# Patient Record
Sex: Female | Born: 1985 | Race: White | Hispanic: No | Marital: Single
Health system: Southern US, Community
[De-identification: ages and names within clinical notes are randomized; demographics above are authoritative.]

---

## 2016-10-31 DIAGNOSIS — C4491 Basal cell carcinoma of skin, unspecified: Secondary | ICD-10-CM

## 2016-10-31 HISTORY — DX: Basal cell carcinoma of skin, unspecified: C44.91

## 2020-07-25 ENCOUNTER — Other Ambulatory Visit: Payer: Self-pay

## 2020-07-25 DIAGNOSIS — Z20822 Contact with and (suspected) exposure to covid-19: Secondary | ICD-10-CM

## 2020-07-27 LAB — NOVEL CORONAVIRUS, NAA: SARS-CoV-2, NAA: NOT DETECTED

## 2020-07-27 LAB — SARS-COV-2, NAA 2 DAY TAT

## 2020-09-17 ENCOUNTER — Ambulatory Visit: Payer: BC Managed Care – PPO | Admitting: Dermatology

## 2020-09-17 ENCOUNTER — Encounter: Payer: Self-pay | Admitting: Dermatology

## 2020-09-17 ENCOUNTER — Other Ambulatory Visit: Payer: Self-pay

## 2020-09-17 DIAGNOSIS — L578 Other skin changes due to chronic exposure to nonionizing radiation: Secondary | ICD-10-CM

## 2020-09-17 DIAGNOSIS — L821 Other seborrheic keratosis: Secondary | ICD-10-CM

## 2020-09-17 DIAGNOSIS — L719 Rosacea, unspecified: Secondary | ICD-10-CM

## 2020-09-17 DIAGNOSIS — D1800 Hemangioma unspecified site: Secondary | ICD-10-CM

## 2020-09-17 DIAGNOSIS — D229 Melanocytic nevi, unspecified: Secondary | ICD-10-CM

## 2020-09-17 DIAGNOSIS — Z1283 Encounter for screening for malignant neoplasm of skin: Secondary | ICD-10-CM | POA: Diagnosis not present

## 2020-09-17 DIAGNOSIS — D2372 Other benign neoplasm of skin of left lower limb, including hip: Secondary | ICD-10-CM | POA: Diagnosis not present

## 2020-09-17 DIAGNOSIS — L858 Other specified epidermal thickening: Secondary | ICD-10-CM

## 2020-09-17 DIAGNOSIS — D239 Other benign neoplasm of skin, unspecified: Secondary | ICD-10-CM

## 2020-09-17 DIAGNOSIS — D485 Neoplasm of uncertain behavior of skin: Secondary | ICD-10-CM | POA: Diagnosis not present

## 2020-09-17 DIAGNOSIS — Z85828 Personal history of other malignant neoplasm of skin: Secondary | ICD-10-CM

## 2020-09-17 HISTORY — DX: Melanocytic nevi, unspecified: D22.9

## 2020-09-17 NOTE — Patient Instructions (Addendum)
Wound Care Instructions  1. Cleanse wound gently with soap and water once a day then pat dry with clean gauze. Apply a thing coat of Petrolatum (petroleum jelly, "Vaseline") over the wound (unless you have an allergy to this). We recommend that you use a new, sterile tube of Vaseline. Do not pick or remove scabs. Do not remove the yellow or white "healing tissue" from the base of the wound.  2. Cover the wound with fresh, clean, nonstick gauze and secure with paper tape. You may use Band-Aids in place of gauze and tape if the would is small enough, but would recommend trimming much of the tape off as there is often too much. Sometimes Band-Aids can irritate the skin.  3. You should call the office for your biopsy report after 1 week if you have not already been contacted.  4. If you experience any problems, such as abnormal amounts of bleeding, swelling, significant bruising, significant pain, or evidence of infection, please call the office immediately.  5. FOR ADULT SURGERY PATIENTS: If you need something for pain relief you may take 1 extra strength Tylenol (acetaminophen) AND 2 Ibuprofen (200mg each) together every 4 hours as needed for pain. (do not take these if you are allergic to them or if you have a reason you should not take them.) Typically, you may only need pain medication for 1 to 3 days.   Instructions for [facility] Medications  One or more of your medications was sent to the [facility] mail order compounding pharmacy. You will receive an email from them and can purchase the medicine through that link. It will then be mailed to your home at the address you confirmed. If for any reason you do not receive an email from them, please check your spam folder. If you still do not find the email, please let us know. [facility] phone number is [phone].   

## 2020-09-17 NOTE — Progress Notes (Signed)
New Patient Visit  Subjective  Hannah Hood is a 34 y.o. female who presents for the following: Annual Exam (Hx of BCC - patient has noticed a pink flaky lesion in area of previous [place]. She has also noticed a pink papule on the tip of her nose that she would like checked). The patient presents for Total-Body Skin Exam (TBSE) for skin cancer screening and mole check.  The following portions of the chart were reviewed this encounter and updated as appropriate:  Allergies  Meds  Problems  Med Hx  Surg Hx  Fam Hx     Review of Systems:  No other skin or systemic complaints except as noted in HPI or Assessment and Plan.  Objective  Well appearing patient in no apparent distress; mood and affect are within normal limits.  A full examination was performed including scalp, head, eyes, ears, nose, lips, neck, chest, axillae, abdomen, back, buttocks, bilateral upper extremities, bilateral lower extremities, hands, feet, fingers, toes, fingernails, and toenails. All findings within normal limits unless otherwise noted below.  Objective  Face: Pinkness and papules of the cheeks, chin, and nose.  Blanching pink macule at [facility] site.   Images      Objective  B/L arms, and legs: Papules with follicular plugging.  Objective  R upper back, paraspinal: Irregular brown macule 0.7 cm   Objective  R mid lat pretibial: Irregular brown macule 0.6 cm   Objective  L med pretibial: Firm pink/brown papulenodule with dimple sign.   Objective  Right Axilla: Flesh colored papule   Assessment & Plan  Rosacea Face With rosacea papule of the nose and right chin. Consider biopsy of the R lat chin and left nasal tip sites if not resolved at follow up appt.   Start [facility] mix Azelaic Acid: 15%, Ivermectin: 1%, Metronidazole: 1% QD-BID 45g 4RF.   Keratosis pilaris B/L arms, and legs Recommend Amlactin Rapid Relief cream   Neoplasm of uncertain behavior of skin (2) R upper  back, paraspinal  Epidermal / dermal shaving  Lesion diameter (cm):  0.7 Informed consent: discussed and consent obtained   Timeout: patient name, date of birth, surgical site, and procedure verified   Procedure prep:  Patient was prepped and draped in usual sterile fashion Prep type:  Isopropyl alcohol Anesthesia: the lesion was anesthetized in a standard fashion   Anesthetic:  1% lidocaine w/ epinephrine 1-100,000 buffered w/ 8.4% NaHCO3 Instrument used: flexible razor blade   Hemostasis achieved with: pressure, aluminum chloride and electrodesiccation   Outcome: patient tolerated procedure well   Post-procedure details: sterile dressing applied and wound care instructions given   Dressing type: bandage and petrolatum    Specimen 1 - Surgical pathology Differential Diagnosis: D48.5 r/o dysplastic nevus  Check Margins: No Irregular brown macule 0.7 cm  R mid lat pretibial  Epidermal / dermal shaving  Lesion diameter (cm):  0.6 Informed consent: discussed and consent obtained   Timeout: patient name, date of birth, surgical site, and procedure verified   Procedure prep:  Patient was prepped and draped in usual sterile fashion Prep type:  Isopropyl alcohol Anesthesia: the lesion was anesthetized in a standard fashion   Anesthetic:  1% lidocaine w/ epinephrine 1-100,000 buffered w/ 8.4% NaHCO3 Instrument used: flexible razor blade   Hemostasis achieved with: pressure, aluminum chloride and electrodesiccation   Outcome: patient tolerated procedure well   Post-procedure details: sterile dressing applied and wound care instructions given   Dressing type: bandage and petrolatum  Specimen 2 - Surgical pathology Differential Diagnosis: D48.5 r/o dysplastic nevus  Check Margins: No Irregular brown macule 0.6 cm  Dermatofibroma L med pretibial Benign, observe.   Irritated nevus Right Axilla Plan shave removal at follow-up appointment   Lentigines - Scattered tan  macules - Discussed due to sun exposure - Benign, observe - Call for any changes  Seborrheic Keratoses - Stuck-on, waxy, tan-brown papules and plaques  - Discussed benign etiology and prognosis. - Observe - Call for any changes  Melanocytic Nevi - Tan-brown and/or pink-flesh-colored symmetric macules and papules - Benign appearing on exam today - Observation - Call clinic for new or changing moles - Recommend daily use of broad spectrum spf 30+ sunscreen to sun-exposed areas.   Hemangiomas - Red papules - Discussed benign nature - Observe - Call for any changes  Actinic Damage - diffuse scaly erythematous macules with underlying dyspigmentation - Recommend daily broad spectrum sunscreen SPF 30+ to sun-exposed areas, reapply every 2 hours as needed.  - Call for new or changing lesions.  History of Basal Cell Carcinoma of the Skin - right chin S/P surgery - No evidence of recurrence today - but red area to be re-checked next visit - Recommend regular full body skin exams - Recommend daily broad spectrum sunscreen SPF 30+ to sun-exposed areas, reapply every 2 hours as needed.  - Call if any new or changing lesions are noted between office visits  Return for F/U appt. in 6-8 weeks. Plan shave removal of irritated nevus (R axilla) at follow up appointment   I, Rudell Cobb, CMA, am acting as scribe for [name], MD .  Documentation: I have reviewed the above documentation for accuracy and completeness, and I agree with the above.  [name], MD

## 2020-09-19 ENCOUNTER — Telehealth: Payer: Self-pay

## 2020-09-19 NOTE — Telephone Encounter (Signed)
Left message for patient to call office for results/hd 

## 2020-09-19 NOTE — Telephone Encounter (Signed)
-----   Message from Ralene Bathe, MD sent at 09/19/2020  5:28 PM EDT ----- 1. Skin , right upper back, paraspinal DYSPLASTIC COMPOUND NEVUS WITH MILD ATYPIA, LIMITED MARGINS FREE 2. Skin , right mid lat pretibial MELANOCYTIC NEVUS WITH HYPERPIGMENTATION WITH FOCAL SCAR, SEE DESCRIPTION  1- dysplastic Mild Recheck next visit 2- benign mole  Keep follow up appt

## 2020-09-20 NOTE — Telephone Encounter (Signed)
Informed pt of results. She had no concerns.  °

## 2020-11-15 ENCOUNTER — Encounter: Payer: Self-pay | Admitting: Dermatology

## 2020-11-15 ENCOUNTER — Ambulatory Visit: Payer: BC Managed Care – PPO | Admitting: Dermatology

## 2020-11-15 ENCOUNTER — Other Ambulatory Visit: Payer: Self-pay

## 2020-11-15 DIAGNOSIS — D225 Melanocytic nevi of trunk: Secondary | ICD-10-CM

## 2020-11-15 DIAGNOSIS — Z86018 Personal history of other benign neoplasm: Secondary | ICD-10-CM | POA: Diagnosis not present

## 2020-11-15 DIAGNOSIS — D229 Melanocytic nevi, unspecified: Secondary | ICD-10-CM

## 2020-11-15 DIAGNOSIS — Z85828 Personal history of other malignant neoplasm of skin: Secondary | ICD-10-CM | POA: Diagnosis not present

## 2020-11-15 DIAGNOSIS — L719 Rosacea, unspecified: Secondary | ICD-10-CM | POA: Diagnosis not present

## 2020-11-15 DIAGNOSIS — D2339 Other benign neoplasm of skin of other parts of face: Secondary | ICD-10-CM

## 2020-11-15 DIAGNOSIS — D2330 Other benign neoplasm of skin of unspecified part of face: Secondary | ICD-10-CM

## 2020-11-15 NOTE — Patient Instructions (Signed)

## 2020-11-15 NOTE — Progress Notes (Signed)
   Follow-Up Visit   Subjective  Hannah Hood is a 34 y.o. female who presents for the following: Rosacea (Face, f/u using [facility] triple cream qhs, still have a few red spots) and dysplastic nevus bx proven (R upper back paraspinal, bx 09/17/20).  The following portions of the chart were reviewed this encounter and updated as appropriate:   Allergies  Meds  Problems  Med Hx  Surg Hx  Fam Hx     Review of Systems:  No other skin or systemic complaints except as noted in HPI or Assessment and Plan.  Objective  Well appearing patient in no apparent distress; mood and affect are within normal limits.  A focused examination was performed including R axilla. Relevant physical exam findings are noted in the Assessment and Plan.  Objective  face: Erythema face  Objective  Right Upper Back paraspinal: Scar with repigmentation  Objective  Nasal tip: 2.52mm pink blanching flat pap  Images    Objective  R lat chin: Scar with telangiectasias that blanch with diascopy   Images    Objective  Right Axilla: Flesh pap   Assessment & Plan  Rosacea face Chronic Pt may d/c [facility] Triple cream since no paps just telangiectatic rosacea  History of dysplastic nevus Right Upper Back paraspinal With repigmentation Plan shave removal on f/u  Fibrous papule of face Nasal tip Benign-appearing.  Observation.  Call clinic for new or changing moles.  Recommend daily use of broad spectrum spf 30+ sunscreen to sun-exposed areas.    History of basal cell carcinoma (BCC) R lat chin Treated with [facility] 2017 in [facility] No evidence of recurrence Observe for changes, plan bx if changes  Nevus Right Axilla Irritated nevus Plan shave removal on f/u  Return in about 2 months (around 01/16/2021) for shave removal of repigmented dysplastic nevus back and Irritated nevus shave removal.  I, Hannah Hood, RMA, am acting as scribe for [name], MD  .  Documentation: I have reviewed the above documentation for accuracy and completeness, and I agree with the above.  [name], MD

## 2020-11-21 ENCOUNTER — Encounter: Payer: Self-pay | Admitting: Dermatology

## 2021-01-21 ENCOUNTER — Ambulatory Visit: Payer: BC Managed Care – PPO | Admitting: Dermatology

## 2021-05-06 ENCOUNTER — Other Ambulatory Visit: Payer: Self-pay | Admitting: Infectious Diseases

## 2021-05-06 DIAGNOSIS — Z1231 Encounter for screening mammogram for malignant neoplasm of breast: Secondary | ICD-10-CM

## 2021-09-23 ENCOUNTER — Other Ambulatory Visit: Payer: Self-pay

## 2021-09-23 ENCOUNTER — Ambulatory Visit
Admission: RE | Admit: 2021-09-23 | Discharge: 2021-09-23 | Disposition: A | Discharge status: Home / Self Care | Payer: BC Managed Care – PPO | Source: Ambulatory Visit | Attending: Infectious Diseases | Admitting: Infectious Diseases

## 2021-09-23 DIAGNOSIS — Z1231 Encounter for screening mammogram for malignant neoplasm of breast: Secondary | ICD-10-CM | POA: Insufficient documentation

## 2021-12-04 ENCOUNTER — Ambulatory Visit: Payer: BC Managed Care – PPO | Admitting: Dermatology

## 2021-12-04 ENCOUNTER — Other Ambulatory Visit: Payer: Self-pay

## 2021-12-04 DIAGNOSIS — L0100 Impetigo, unspecified: Secondary | ICD-10-CM | POA: Diagnosis not present

## 2021-12-04 DIAGNOSIS — L089 Local infection of the skin and subcutaneous tissue, unspecified: Secondary | ICD-10-CM

## 2021-12-04 DIAGNOSIS — D485 Neoplasm of uncertain behavior of skin: Secondary | ICD-10-CM | POA: Diagnosis not present

## 2021-12-04 DIAGNOSIS — Z86018 Personal history of other benign neoplasm: Secondary | ICD-10-CM | POA: Diagnosis not present

## 2021-12-04 MED ORDER — XEPI 1 % EX CREA: TOPICAL_CREAM | CUTANEOUS | 0 refills | Status: AC

## 2021-12-04 MED ORDER — MUPIROCIN 2 % EX OINT: TOPICAL_OINTMENT | CUTANEOUS | 0 refills | Status: DC

## 2021-12-04 MED ORDER — DOXYCYCLINE HYCLATE 100 MG PO TABS: ORAL_TABLET | ORAL | 0 refills | Status: AC

## 2021-12-04 NOTE — Progress Notes (Signed)
° °  Follow-Up Visit   Subjective  Hannah Hood is a 36 y.o. female who presents for the following: Irregular skin lesion (Of the L temple/hairline - new, has been growing in size, tender, patient is concerned and would like it removed today.), Re-pigmented dysplastic nevus (Of the R upper back paraspinal - bx proven 09/17/2020, patient is here today for shave removal), and Irritated skin lesion (R axilla - patient would like it removed today).  The following portions of the chart were reviewed this encounter and updated as appropriate:   Allergies   Meds   Problems   Med Hx   Surg Hx   Fam Hx      Review of Systems:  No other skin or systemic complaints except as noted in HPI or Assessment and Plan.  Objective  Well appearing patient in no apparent distress; mood and affect are within normal limits.  A focused examination was performed including the face, axilla, and back. Relevant physical exam findings are noted in the Assessment and Plan.  L temple Crusted plaque.     R upper back paraspinal Re-pigmented brown macule.   Right Axilla Papule.   Assessment & Plan  Local infection of skin and subcutaneous tissue Crusted plaque with impetigo L temple Start Doxycycline 100mg  po BID x 10 days.  Doxycycline should be taken with food to prevent nausea. Do not lay down for 30 minutes after taking. Be cautious with sun exposure and use good sun protection while on this medication. Pregnant women should not take this medication.   Start Xepi or Mupirocin 2% ointment QD.   Plan shave removal or debridement if crusted plaque persists at follow up once no longer infected.  Ozenoxacin (XEPI) 1 % CREA - L temple Apply to aa's QD until healed. mupirocin ointment (BACTROBAN) 2 % - L temple Apply to wound QD until healed. doxycycline (VIBRA-TABS) 100 MG tablet - L temple Take one tab po BID x 10 days. Take with food.  History of dysplastic nevus R upper back paraspinal Mild, bx proven  - Plan shave removal at follow up appointment.   Neoplasm of uncertain behavior of skin Right Axilla Irritated skin lesion - plan shave removal at follow up appointment.  Return for shave removal x 3 in 4-6 weeks.  Luther Redo, CMA, am acting as scribe for [name], MD . Documentation: I have reviewed the above documentation for accuracy and completeness, and I agree with the above.  [name], MD

## 2021-12-04 NOTE — Patient Instructions (Addendum)

## 2021-12-05 ENCOUNTER — Encounter: Payer: Self-pay | Admitting: Dermatology

## 2022-01-06 ENCOUNTER — Ambulatory Visit: Payer: BC Managed Care – PPO | Admitting: Dermatology

## 2022-01-06 ENCOUNTER — Encounter: Payer: Self-pay | Admitting: Dermatology

## 2022-01-06 ENCOUNTER — Other Ambulatory Visit: Payer: Self-pay

## 2022-01-06 DIAGNOSIS — D485 Neoplasm of uncertain behavior of skin: Secondary | ICD-10-CM

## 2022-01-06 DIAGNOSIS — D235 Other benign neoplasm of skin of trunk: Secondary | ICD-10-CM

## 2022-01-06 DIAGNOSIS — C44319 Basal cell carcinoma of skin of other parts of face: Secondary | ICD-10-CM

## 2022-01-06 DIAGNOSIS — Z85828 Personal history of other malignant neoplasm of skin: Secondary | ICD-10-CM | POA: Diagnosis not present

## 2022-01-06 DIAGNOSIS — D239 Other benign neoplasm of skin, unspecified: Secondary | ICD-10-CM

## 2022-01-06 DIAGNOSIS — L089 Local infection of the skin and subcutaneous tissue, unspecified: Secondary | ICD-10-CM

## 2022-01-06 DIAGNOSIS — Z86018 Personal history of other benign neoplasm: Secondary | ICD-10-CM | POA: Diagnosis not present

## 2022-01-06 MED ORDER — MUPIROCIN 2 % EX OINT: TOPICAL_OINTMENT | CUTANEOUS | 0 refills | Status: DC

## 2022-01-06 NOTE — Progress Notes (Signed)
Follow-Up Visit   Subjective  Hannah Hood is a 36 y.o. female who presents for the following: Irregular skin lesion (On the L scalp - patient finished Doxycycline and used the Mupirocin 2% ointment TID. She thinks lesion is no longer infected but is still crusted and irritated.). Patient was also scheduled to have an irritated skin lesion on her R axilla and a recurrent dysplastic nevus that needs to be to be removed. She is leaving for a cruise in 5 days and would like to defer removing those at this time.  She has hx of MOHS for BCC chin in past.  The following portions of the chart were reviewed this encounter and updated as appropriate:   Allergies   Meds   Problems   Med Hx   Surg Hx   Fam Hx      Review of Systems:  No other skin or systemic complaints except as noted in HPI or Assessment and Plan.  Objective  Well appearing patient in no apparent distress; mood and affect are within normal limits.  A focused examination was performed including the scalp. Relevant physical exam findings are noted in the Assessment and Plan.  L temple/scalp 3.1 cm pink crusted plaque.  Right Axilla Papule.  Assessment & Plan  Neoplasm of uncertain behavior of skin L temple/scalp Epidermal / dermal shaving  Lesion diameter (cm):  3.1 Informed consent: discussed and consent obtained   Timeout: patient name, date of birth, surgical site, and procedure verified   Procedure prep:  Patient was prepped and draped in usual sterile fashion Prep type:  Isopropyl alcohol Anesthesia: the lesion was anesthetized in a standard fashion   Anesthetic:  1% lidocaine w/ epinephrine 1-100,000 buffered w/ 8.4% NaHCO3 Instrument used: flexible razor blade   Hemostasis achieved with: pressure, aluminum chloride and electrodesiccation   Outcome: patient tolerated procedure well   Post-procedure details: sterile dressing applied and wound care instructions given   Dressing type: bandage and petrolatum     Destruction of lesion Complexity: extensive   Destruction method: electrodesiccation and curettage   Informed consent: discussed and consent obtained   Timeout:  patient name, date of birth, surgical site, and procedure verified Procedure prep:  Patient was prepped and draped in usual sterile fashion Prep type:  Isopropyl alcohol Anesthesia: the lesion was anesthetized in a standard fashion   Anesthetic:  1% lidocaine w/ epinephrine 1-100,000 buffered w/ 8.4% NaHCO3 Curettage performed in three different directions: Yes   Electrodesiccation performed over the curetted area: Yes   Lesion length (cm):  3.1 Lesion width (cm):  3.1 Margin per side (cm):  0.3 Final wound size (cm):  3.7 Hemostasis achieved with:  pressure, aluminum chloride and electrodesiccation Outcome: patient tolerated procedure well with no complications   Post-procedure details: sterile dressing applied and wound care instructions given   Dressing type: bandage and petrolatum    Specimen 1 - Surgical pathology Differential Diagnosis: D48.5 r/o BCC vs other  ED&C today  Check Margins: No  No longer infected, but persistent, tender, and bleeds.  Continue Mupirocin 2% ointment QD to aa until healed. Once healed recommend Serica moisturizing scar formula cream every night or [facility] brand or Mederma silicone scar sheet every night for the first year after a scar appears to help with scar remodeling if desired. Scars remodel on their own for a full year.  History of Basal Cell Carcinoma of the Skin - chin s/p Mohs - No evidence of recurrence today - Recommend regular full  body skin exams - Recommend daily broad spectrum sunscreen SPF 30+ to sun-exposed areas, reapply every 2 hours as needed.  - Call if any new or changing lesions are noted between office visits  History of dysplastic nevus R upper back paraspinal Bx proven - mild, recurrent  Plan shave removal at follow up appointment.  Other benign neoplasm  of skin, unspecified Right Axilla Irritated nevus vs other -  Plan shave removal at follow up appointment.  doxycycline (VIBRA-TABS) 100 MG tablet Take one tab po BID x 10 days. Take with food. mupirocin ointment (BACTROBAN) 2 % Apply to wound QD until healed.  Return for TBSE in 4-6 weeks, bx x 2.  I, Rudell Cobb, CMA, am acting as scribe for [name], MD . Documentation: I have reviewed the above documentation for accuracy and completeness, and I agree with the above.  [name], MD

## 2022-01-06 NOTE — Patient Instructions (Addendum)
Electrodesiccation and Curettage (Scrape and Burn) Wound Care Instructions  Leave the original bandage on for 24 hours if possible.  If the bandage becomes soaked or soiled before that time, it is OK to remove it and examine the wound.  A small amount of post-operative bleeding is normal.  If excessive bleeding occurs, remove the bandage, place gauze over the site and apply continuous pressure (no peeking) over the area for 30 minutes. If this does not work, please call our clinic as soon as possible or page your doctor if it is after hours.   Once a day, cleanse the wound with soap and water. It is fine to shower. If a thick crust develops you may use a Q-tip dipped into dilute hydrogen peroxide (mix 1:1 with water) to dissolve it.  Hydrogen peroxide can slow the healing process, so use it only as needed.    After washing, apply petroleum jelly (Vaseline) or an antibiotic ointment if your doctor prescribed one for you, followed by a bandage.    For best healing, the wound should be covered with a layer of ointment at all times. If you are not able to keep the area covered with a bandage to hold the ointment in place, this may mean re-applying the ointment several times a day.  Continue this wound care until the wound has healed and is no longer open. It may take several weeks for the wound to heal and close.  Itching and mild discomfort is normal during the healing process.  If you have any discomfort, you can take Tylenol (acetaminophen) or ibuprofen as directed on the bottle. (Please do not take these if you have an allergy to them or cannot take them for another reason).  Some redness, tenderness and white or yellow material in the wound is normal healing.  If the area becomes very sore and red, or develops a thick yellow-green material (pus), it may be infected; please notify us.    Wound healing continues for up to one year following surgery. It is not unusual to experience pain in the scar  from time to time during the interval.  If the pain becomes severe or the scar thickens, you should notify the office.    A slight amount of redness in a scar is expected for the first six months.  After six months, the redness will fade and the scar will soften and fade.  The color difference becomes less noticeable with time.  If there are any problems, return for a post-op surgery check at your earliest convenience.  To improve the appearance of the scar, you can use silicone scar gel, cream, or sheets (such as Mederma or Serica) every night for up to one year. These are available over the counter (without a prescription).  Please call our office at [phone] for any questions or concerns.  If You Need Anything After Your Visit  If you have any questions or concerns for your doctor, please call our main line at [phone] and press option 4 to reach your doctor's medical assistant. If no one answers, please leave a voicemail as directed and we will return your call as soon as possible. Messages left after 4 pm will be answered the following business day.   You may also send [place] a message via [place]. We typically respond to MyChart messages within 1-2 business days.  For prescription refills, please ask your pharmacy to contact our office. Our fax number is [phone].  If you have an  urgent issue when the clinic is closed that cannot wait until the next business day, you can page your doctor at the number below.    Please note that while we do our best to be available for urgent issues outside of office hours, we are not available 24/7.   If you have an urgent issue and are unable to reach [place], you may choose to seek medical care at your doctor's office, retail clinic, urgent care center, or emergency room.  If you have a medical emergency, please immediately call 911 or go to the emergency department.  Pager Numbers  - Dr. Nehemiah Massed: [phone]  - Dr. Laurence Ferrari: [phone]  -  Dr. Nicole Kindred: [phone]  In the event of inclement weather, please call our main line at [phone] for an update on the status of any delays or closures.  Dermatology Medication Tips: Please keep the boxes that topical medications come in in order to help keep track of the instructions about where and how to use these. Pharmacies typically print the medication instructions only on the boxes and not directly on the medication tubes.   If your medication is too expensive, please contact our office at [phone] option 4 or send [place] a message through [place].   We are unable to tell what your co-pay for medications will be in advance as this is different depending on your insurance coverage. However, we may be able to find a substitute medication at lower cost or fill out paperwork to get insurance to cover a needed medication.   If a prior authorization is required to get your medication covered by your insurance company, please allow [place] 1-2 business days to complete this process.  Drug prices often vary depending on where the prescription is filled and some pharmacies may offer cheaper prices.  The website www.goodrx.com contains coupons for medications through different pharmacies. The prices here do not account for what the cost may be with help from insurance (it may be cheaper with your insurance), but the website can give you the price if you did not use any insurance.  - You can print the associated coupon and take it with your prescription to the pharmacy.  - You may also stop by our office during regular business hours and pick up a GoodRx coupon card.  - If you need your prescription sent electronically to a different pharmacy, notify our office through [facility] or by phone at [phone] option 4.     Si Usted Necesita Algo Despus de Su Visita  Tambin puede enviarnos un mensaje a travs de Pharmacist, community. Por lo general respondemos a los mensajes de MyChart en el  transcurso de 1 a 2 das hbiles.  Para renovar recetas, por favor pida a su farmacia que se ponga en contacto con nuestra oficina. Harland Dingwall de fax es [place] [phone].  Si tiene un asunto urgente cuando la clnica est cerrada y que no puede esperar hasta el siguiente da hbil, puede llamar/localizar a su doctor(a) al nmero que aparece a continuacin.   Por favor, tenga en cuenta que aunque hacemos todo lo posible para estar disponibles para asuntos urgentes fuera del horario de [place], no estamos disponibles las 24 horas del da, los 7 das de la [place].   Si tiene un problema urgente y no puede comunicarse con nosotros, puede optar por buscar atencin mdica  en el consultorio de su doctor(a), en una clnica privada, en un centro de atencin urgente o en una sala de emergencias.  Si tiene Engineering geologist, por favor llame inmediatamente al 911 o vaya a la sala de emergencias.  Nmeros de bper  - Dr. Nehemiah Massed: [phone]  - Dra. Moye: [phone]  - Dra. Nicole Kindred: [phone]  En caso de inclemencias del [place], por favor llame a Johnsie Kindred principal al [phone] para una actualizacin sobre el [place] de cualquier retraso o cierre.  Consejos para la medicacin en dermatologa: Por favor, guarde las cajas en las que vienen los medicamentos de uso tpico para ayudarle a seguir las instrucciones sobre dnde y cmo usarlos. Las farmacias generalmente imprimen las instrucciones del medicamento slo en las cajas y no directamente en los tubos del [place].   Si su medicamento es muy caro, por favor, pngase en contacto con Zigmund Daniel llamando al [phone] y presione la opcin 4 o envenos un mensaje a travs de Pharmacist, community.   No podemos decirle cul ser su copago por los medicamentos por adelantado ya que esto es diferente dependiendo de la cobertura de su seguro. Sin embargo, es posible que podamos encontrar un medicamento sustituto a Electrical engineer un  formulario para que el seguro cubra el medicamento que se considera necesario.   Si se requiere una autorizacin previa para que su compaa de seguros [place] su medicamento, por favor permtanos de 1 a 2 das hbiles para completar este proceso.  Los precios de los medicamentos varan con frecuencia dependiendo del Environmental consultant de dnde se surte la receta y alguna farmacias pueden ofrecer precios ms baratos.  El sitio web www.goodrx.com tiene cupones para medicamentos de Airline pilot. Los precios aqu no tienen en cuenta lo que podra costar con la ayuda del seguro (puede ser ms barato con su seguro), pero el sitio web puede darle el precio si no utiliz Research scientist (physical sciences).  - Puede imprimir el cupn correspondiente y llevarlo con su receta a la farmacia.  - Tambin puede pasar por nuestra oficina durante el horario de atencin regular y Charity fundraiser una tarjeta de cupones de GoodRx.  - Si necesita que su receta se enve electrnicamente a una farmacia diferente, informe a nuestra oficina a travs de MyChart de [place] o por telfono llamando al [phone] y presione la opcin 4.

## 2022-01-07 ENCOUNTER — Telehealth: Payer: Self-pay

## 2022-01-07 ENCOUNTER — Encounter: Payer: Self-pay | Admitting: Dermatology

## 2022-01-07 NOTE — Telephone Encounter (Signed)
-----   Message from Ralene Bathe, MD sent at 01/07/2022  4:29 PM EST ----- Diagnosis Skin (M), left temple/scalp BASAL CELL CARCINOMA WITH FOCAL SCLEROSIS, ULCERATED, SEE DESCRIPTION  Cancer - BCC Already treated Recheck next visit - keep appt

## 2022-01-07 NOTE — Telephone Encounter (Signed)
Discussed biopsy results with pt  °

## 2022-01-08 ENCOUNTER — Ambulatory Visit: Payer: BC Managed Care – PPO | Admitting: Dermatology

## 2022-01-21 ENCOUNTER — Other Ambulatory Visit: Payer: Self-pay

## 2022-01-21 DIAGNOSIS — L089 Local infection of the skin and subcutaneous tissue, unspecified: Secondary | ICD-10-CM

## 2022-01-21 MED ORDER — MUPIROCIN 2 % EX OINT: TOPICAL_OINTMENT | CUTANEOUS | 0 refills | Status: AC

## 2022-02-03 ENCOUNTER — Encounter: Payer: Self-pay | Admitting: Dermatology

## 2022-02-03 ENCOUNTER — Ambulatory Visit: Payer: BC Managed Care – PPO | Admitting: Dermatology

## 2022-02-03 ENCOUNTER — Other Ambulatory Visit: Payer: Self-pay

## 2022-02-03 DIAGNOSIS — L814 Other melanin hyperpigmentation: Secondary | ICD-10-CM

## 2022-02-03 DIAGNOSIS — Z85828 Personal history of other malignant neoplasm of skin: Secondary | ICD-10-CM | POA: Diagnosis not present

## 2022-02-03 DIAGNOSIS — Z1283 Encounter for screening for malignant neoplasm of skin: Secondary | ICD-10-CM

## 2022-02-03 DIAGNOSIS — D225 Melanocytic nevi of trunk: Secondary | ICD-10-CM

## 2022-02-03 DIAGNOSIS — L578 Other skin changes due to chronic exposure to nonionizing radiation: Secondary | ICD-10-CM

## 2022-02-03 DIAGNOSIS — Z86018 Personal history of other benign neoplasm: Secondary | ICD-10-CM

## 2022-02-03 DIAGNOSIS — D229 Melanocytic nevi, unspecified: Secondary | ICD-10-CM

## 2022-02-03 DIAGNOSIS — D485 Neoplasm of uncertain behavior of skin: Secondary | ICD-10-CM

## 2022-02-03 DIAGNOSIS — L821 Other seborrheic keratosis: Secondary | ICD-10-CM

## 2022-02-03 DIAGNOSIS — D1800 Hemangioma unspecified site: Secondary | ICD-10-CM

## 2022-02-03 DIAGNOSIS — L719 Rosacea, unspecified: Secondary | ICD-10-CM | POA: Diagnosis not present

## 2022-02-03 DIAGNOSIS — R21 Rash and other nonspecific skin eruption: Secondary | ICD-10-CM | POA: Diagnosis not present

## 2022-02-03 NOTE — Progress Notes (Signed)
? ?Follow-Up Visit ?  ?Subjective  ?Hannah Hood is a 36 y.o. female who presents for the following: Annual Exam (History of BCC and Dysplastic nevus - TBSE today). ?The patient presents for Total-Body Skin Exam (TBSE) for skin cancer screening and mole check.  The patient has spots, moles and lesions to be evaluated, some may be new or changing and the patient has concerns that these could be cancer. ? ?The following portions of the chart were reviewed this encounter and updated as appropriate:  ? Allergies  Meds  Problems  Med Hx  Surg Hx  Fam Hx   ?  ?Review of Systems:  No other skin or systemic complaints except as noted in HPI or Assessment and Plan. ? ?Objective  ?Well appearing patient in no apparent distress; mood and affect are within normal limits. ? ?A full examination was performed including scalp, head, eyes, ears, nose, lips, neck, chest, axillae, abdomen, back, buttocks, bilateral upper extremities, bilateral lower extremities, hands, feet, fingers, toes, fingernails, and toenails. All findings within normal limits unless otherwise noted below. ? ?Left Temple/scalp ?Healing EDC site ? ?Rigth chin ?0.9 x 0.7 cm pink patch medial to BCC scar ? ? ? ? ?Face ?Pinkness ? ?Right Axilla ?0.7 cm flesh colored papule ? ? ? ? ?Right Upper Back paraspinal ?Well healed biopsy site with 0.4 cm area of repigmentation ? ? ? ? ? ?Assessment & Plan  ? ?History of Basal Cell Carcinoma of the Skin-left temple site still healing and ulcerated.  Recheck next visit ?- No evidence of recurrence today ?- Recommend regular full body skin exams ?- Recommend daily broad spectrum sunscreen SPF 30+ to sun-exposed areas, reapply every 2 hours as needed.  ?- Call if any new or changing lesions are noted between office visits ? ?History of Dysplastic Nevi ?- No evidence of recurrence today ?- Recommend regular full body skin exams ?- Recommend daily broad spectrum sunscreen SPF 30+ to sun-exposed areas, reapply every 2 hours  as needed.  ?- Call if any new or changing lesions are noted between office visits ? ?Lentigines ?- Scattered tan macules ?- Due to sun exposure ?- Benign-appearing, observe ?- Recommend daily broad spectrum sunscreen SPF 30+ to sun-exposed areas, reapply every 2 hours as needed. ?- Call for any changes ? ?Seborrheic Keratoses ?- Stuck-on, waxy, tan-brown papules and/or plaques  ?- Benign-appearing ?- Discussed benign etiology and prognosis. ?- Observe ?- Call for any changes ? ?Melanocytic Nevi ?- Tan-brown and/or pink-flesh-colored symmetric macules and papules ?- Benign appearing on exam today ?- Observation ?- Call clinic for new or changing moles ?- Recommend daily use of broad spectrum spf 30+ sunscreen to sun-exposed areas.  ? ?Hemangiomas ?- Red papules ?- Discussed benign nature ?- Observe ?- Call for any changes ? ?Actinic Damage ?- Chronic condition, secondary to cumulative UV/sun exposure ?- diffuse scaly erythematous macules with underlying dyspigmentation ?- Recommend daily broad spectrum sunscreen SPF 30+ to sun-exposed areas, reapply every 2 hours as needed.  ?- Staying in the shade or wearing long sleeves, sun glasses (UVA+UVB protection) and wide brim hats (4-inch brim around the entire circumference of the hat) are also recommended for sun protection.  ?- Call for new or changing lesions. ? ?Skin cancer screening performed today. ? ?History of basal cell carcinoma (BCC) ?Left Temple/scalp ? ?Healing well. Discussed risk of recurrence. Recheck on follow up ? ?Rash ?Right chin medial edge of BCC excision scar 0.9 x 0.7 cm pink patch ?Avar LS qd - samples  given ?Recheck on follow up. Consider biopsy vs topical chemotherapy ? ?Rosacea ?Face ?Rosacea is a chronic progressive skin condition usually affecting the face of adults, causing redness and/or acne bumps. It is treatable but not curable. It sometimes affects the eyes (ocular rosacea) as well. It may respond to topical and/or systemic medication  and can flare with stress, sun exposure, alcohol, exercise and some foods.  Daily application of broad spectrum spf 30+ sunscreen to face is recommended to reduce flares. ?Discussed the treatment option of BBL/laser.  Typically we recommend 1-3 treatment sessions about 5-8 weeks apart for best results.  The patient's condition may require "maintenance treatments" in the future.  The fee for BBL / laser treatments is $350 per treatment session for the whole face.  A fee can be quoted for other parts of the body. ?Insurance typically does not pay for BBL/laser treatments and therefore the fee is an out-of-pocket cost. ? ?Mild - no treatment today. ? ?Neoplasm of uncertain behavior of skin (2) ?Right Axilla ?Epidermal / dermal shaving ?Lesion diameter (cm):  0.7 ?Informed consent: discussed and consent obtained   ?Timeout: patient name, date of birth, surgical site, and procedure verified   ?Procedure prep:  Patient was prepped and draped in usual sterile fashion ?Prep type:  Isopropyl alcohol ?Anesthesia: the lesion was anesthetized in a standard fashion   ?Anesthetic:  1% lidocaine w/ epinephrine 1-100,000 buffered w/ 8.4% NaHCO3 ?Instrument used: flexible razor blade   ?Hemostasis achieved with: pressure, aluminum chloride and electrodesiccation   ?Outcome: patient tolerated procedure well   ?Post-procedure details: sterile dressing applied and wound care instructions given   ?Dressing type: bandage and petrolatum   ? ?Specimen 1 - Surgical pathology ?Differential Diagnosis: Irritated nevus vs other ?Check Margins: No ? ?Right Upper Back paraspinal ?Epidermal / dermal shaving ? ?Lesion diameter (cm):  0.4 ?Informed consent: discussed and consent obtained   ?Timeout: patient name, date of birth, surgical site, and procedure verified   ?Procedure prep:  Patient was prepped and draped in usual sterile fashion ?Prep type:  Isopropyl alcohol ?Anesthesia: the lesion was anesthetized in a standard fashion   ?Anesthetic:   1% lidocaine w/ epinephrine 1-100,000 buffered w/ 8.4% NaHCO3 ?Instrument used: flexible razor blade   ?Hemostasis achieved with: pressure, aluminum chloride and electrodesiccation   ?Outcome: patient tolerated procedure well   ?Post-procedure details: sterile dressing applied and wound care instructions given   ?Dressing type: bandage and petrolatum   ? ?Specimen 2 - Surgical pathology ?Differential Diagnosis: Recurrent dysplastic nevus vs other  ?Check Margins: No ?[id number] ? ?Skin cancer screening ? ?Return for 2-3 months Recheck BCC site of left temple and recheck right chin. ?Documentation: I have reviewed the above documentation for accuracy and completeness, and I agree with the above. ? ?[name], MD ? ? ?

## 2022-02-03 NOTE — Patient Instructions (Signed)

## 2022-02-05 ENCOUNTER — Telehealth: Payer: Self-pay

## 2022-02-05 NOTE — Telephone Encounter (Signed)
-----   Message from Ralene Bathe, MD sent at 02/04/2022  6:28 PM EST ----- ?Diagnosis ?1. Skin , right axilla ?MELANOCYTIC NEVUS, COMPOUND TYPE ?2. Skin , right upper back paraspinal ?RECURRENT MELANOCYTIC NEVUS, CLOSE TO MARGIN ? ?1- benign mole ?No further treatment needed ?2- Recurrent mole ?At site of mild dysplastic ?Recheck next visit ?

## 2022-02-05 NOTE — Telephone Encounter (Signed)
Left message on voicemail to return my call.  

## 2022-02-05 NOTE — Telephone Encounter (Signed)
Patient advised of BX results .aw 

## 2022-02-10 ENCOUNTER — Ambulatory Visit: Payer: BC Managed Care – PPO | Admitting: Dermatology

## 2022-02-13 ENCOUNTER — Ambulatory Visit: Payer: BC Managed Care – PPO | Admitting: Dermatology

## 2022-03-26 ENCOUNTER — Ambulatory Visit: Payer: BC Managed Care – PPO | Admitting: Dermatology

## 2022-03-26 DIAGNOSIS — Z85828 Personal history of other malignant neoplasm of skin: Secondary | ICD-10-CM

## 2022-03-26 DIAGNOSIS — D492 Neoplasm of unspecified behavior of bone, soft tissue, and skin: Secondary | ICD-10-CM

## 2022-03-26 DIAGNOSIS — C44319 Basal cell carcinoma of skin of other parts of face: Secondary | ICD-10-CM | POA: Diagnosis not present

## 2022-03-26 MED ORDER — FLUOROURACIL 5 % EX CREA: TOPICAL_CREAM | CUTANEOUS | 1 refills | Status: AC

## 2022-03-26 NOTE — Patient Instructions (Addendum)

## 2022-03-26 NOTE — Progress Notes (Signed)
? ?  Follow-Up Visit ?  ?Subjective  ?Hannah Hood is a 36 y.o. female who presents for the following: Follow-up (1 month f/u on the right chin ulcerated area - Self-treating with Avar cream with a poor response, hx of BCC in this area ). ? ?The following portions of the chart were reviewed this encounter and updated as appropriate:  ? Allergies  Meds  Problems  Med Hx  Surg Hx  Fam Hx   ?  ?Review of Systems:  No other skin or systemic complaints except as noted in HPI or Assessment and Plan. ? ?Objective  ?Well appearing patient in no apparent distress; mood and affect are within normal limits. ? ?A focused examination was performed including face. Relevant physical exam findings are noted in the Assessment and Plan. ? ?left temple ?Pink scar ? ?right chin ?1.0 cm Pink  patch  ? ? ? ? ? ? ? ?Assessment & Plan  ?Basal cell carcinoma (BCC) of skin of other part of face ?left temple ? ?Biopsy proven BCC treated with [facility] 01/06/2022 ? ?Start Adjunct topical Chemotherapy to Left temple Basal Cell Carcinoma site with : ?5FU/Calcipotriene cream apply to left temple twice a day x 7 days  ? ?Related Medications ?fluorouracil (EFUDEX) 5 % cream ?Apply twice a day for 7 days ? ?Neoplasm of skin ?right chin ? ?Skin / nail biopsy ?Type of biopsy: tangential   ?Informed consent: discussed and consent obtained   ?Patient was prepped and draped in usual sterile fashion: area prepped with alochol. ?Anesthesia: the lesion was anesthetized in a standard fashion   ?Anesthetic:  1% lidocaine w/ epinephrine 1-100,000 buffered w/ 8.4% NaHCO3 ?Instrument used: flexible razor blade   ?Hemostasis achieved with: pressure, aluminum chloride and electrodesiccation   ?Outcome: patient tolerated procedure well   ?Post-procedure details: wound care instructions given   ?Post-procedure details comment:  Ointment and small bandage ? ?Specimen 1 - Surgical pathology ?Differential Diagnosis: R/O recurrent BCC ? ?Check Margins: No ?Small specimen   ? ? ?Return for check as scheduled May 01, 2022. ? ?I, Marye Round, CMA, am acting as scribe for [name], MD .  ?Documentation: I have reviewed the above documentation for accuracy and completeness, and I agree with the above. ? ?[name], MD ? ?

## 2022-04-03 ENCOUNTER — Other Ambulatory Visit: Payer: Self-pay

## 2022-04-03 ENCOUNTER — Telehealth: Payer: Self-pay

## 2022-04-03 DIAGNOSIS — C44319 Basal cell carcinoma of skin of other parts of face: Secondary | ICD-10-CM

## 2022-04-03 NOTE — Telephone Encounter (Signed)
-----   Message from Ralene Bathe, MD sent at 04/01/2022  9:30 PM EDT ----- ?Diagnosis ?Skin , right chin ?BASAL CELL CARCINOMA, NODULAR PATTERN, BASE INVOLVED ? ?Cancer - BCC ?Treatment options include: EDC; Radiation or MOHS ?Pt may make an appt to discuss if she would like - please schedule ?If she knows what she wants to do, please schedule appropriately ?

## 2022-04-03 NOTE — Telephone Encounter (Signed)
Discussed biopsy results with pt, pt would like to come into the office and discuss treatment options with Dr Raliegh Ip, appt scheduled ?

## 2022-04-03 NOTE — Telephone Encounter (Signed)
Pt called requesting referral to Dr Lacinda Axon for Mohs surgery of recurrent BCC  ? ?Ok we will send referral to Dr Lacinda Axon today  ?

## 2022-04-07 ENCOUNTER — Ambulatory Visit: Payer: BC Managed Care – PPO | Admitting: Dermatology

## 2022-04-07 ENCOUNTER — Encounter: Payer: Self-pay | Admitting: Dermatology

## 2022-05-01 ENCOUNTER — Ambulatory Visit: Payer: BC Managed Care – PPO | Admitting: Dermatology

## 2022-05-01 DIAGNOSIS — C44319 Basal cell carcinoma of skin of other parts of face: Secondary | ICD-10-CM | POA: Diagnosis not present

## 2022-05-01 NOTE — Patient Instructions (Signed)
Start 5-fluorouracil/calcipotriene cream twice a day for 14 days to affected areas including left temple. Prescription sent to [facility]. Patient provided with contact information for pharmacy and advised the pharmacy will mail the prescription to their home. Patient provided with handout reviewing treatment course and side effects and advised to call or message [place] on MyChart with any concerns.  Recommend daily broad spectrum sunscreen SPF 30+ to sun-exposed areas, reapply every 2 hours as needed. Call for new or changing lesions.  Staying in the shade or wearing long sleeves, sun glasses (UVA+UVB protection) and wide brim hats (4-inch brim around the entire circumference of the hat) are also recommended for sun protection.   If You Need Anything After Your Visit  If you have any questions or concerns for your doctor, please call our main line at [phone] and press option 4 to reach your doctor's medical assistant. If no one answers, please leave a voicemail as directed and we will return your call as soon as possible. Messages left after 4 pm will be answered the following business day.   You may also send [place] a message via [place]. We typically respond to MyChart messages within 1-2 business days.  For prescription refills, please ask your pharmacy to contact our office. Our fax number is [phone].  If you have an urgent issue when the clinic is closed that cannot wait until the next business day, you can page your doctor at the number below.    Please note that while we do our best to be available for urgent issues outside of office hours, we are not available 24/7.   If you have an urgent issue and are unable to reach [place], you may choose to seek medical care at your doctor's office, retail clinic, urgent care center, or emergency room.  If you have a medical emergency, please immediately call 911 or go to the emergency department.  Pager Numbers  - Dr. Nehemiah Massed:  [phone]  - Dr. Laurence Ferrari: [phone]  - Dr. Nicole Kindred: [phone]  In the event of inclement weather, please call our main line at [phone] for an update on the status of any delays or closures.  Dermatology Medication Tips: Please keep the boxes that topical medications come in in order to help keep track of the instructions about where and how to use these. Pharmacies typically print the medication instructions only on the boxes and not directly on the medication tubes.   If your medication is too expensive, please contact our office at [phone] option 4 or send [place] a message through [place].   We are unable to tell what your co-pay for medications will be in advance as this is different depending on your insurance coverage. However, we may be able to find a substitute medication at lower cost or fill out paperwork to get insurance to cover a needed medication.   If a prior authorization is required to get your medication covered by your insurance company, please allow [place] 1-2 business days to complete this process.  Drug prices often vary depending on where the prescription is filled and some pharmacies may offer cheaper prices.  The website www.goodrx.com contains coupons for medications through different pharmacies. The prices here do not account for what the cost may be with help from insurance (it may be cheaper with your insurance), but the website can give you the price if you did not use any insurance.  - You can print the associated coupon and take it with your prescription to  the pharmacy.  - You may also stop by our office during regular business hours and pick up a GoodRx coupon card.  - If you need your prescription sent electronically to a different pharmacy, notify our office through [facility] or by phone at [phone] option 4.     Si Usted Necesita Algo Despus de Su Visita  Tambin puede enviarnos un mensaje a travs de Pharmacist, community. Por lo general  respondemos a los mensajes de MyChart en el transcurso de 1 a 2 das hbiles.  Para renovar recetas, por favor pida a su farmacia que se ponga en contacto con nuestra oficina. Harland Dingwall de fax es [place] [phone].  Si tiene un asunto urgente cuando la clnica est cerrada y que no puede esperar hasta el siguiente da hbil, puede llamar/localizar a su doctor(a) al nmero que aparece a continuacin.   Por favor, tenga en cuenta que aunque hacemos todo lo posible para estar disponibles para asuntos urgentes fuera del horario de [place], no estamos disponibles las 24 horas del da, los 7 das de la [place].   Si tiene un problema urgente y no puede comunicarse con nosotros, puede optar por buscar atencin mdica  en el consultorio de su doctor(a), en una clnica privada, en un centro de atencin urgente o en una sala de emergencias.  Si tiene Engineering geologist, por favor llame inmediatamente al 911 o vaya a la sala de emergencias.  Nmeros de bper  - Dr. Nehemiah Massed: [phone]  - Dra. Moye: [phone]  - Dra. Nicole Kindred: [phone]  En caso de inclemencias del [place], por favor llame a Johnsie Kindred principal al [phone] para una actualizacin sobre el [place] de cualquier retraso o cierre.  Consejos para la medicacin en dermatologa: Por favor, guarde las cajas en las que vienen los medicamentos de uso tpico para ayudarle a seguir las instrucciones sobre dnde y cmo usarlos. Las farmacias generalmente imprimen las instrucciones del medicamento slo en las cajas y no directamente en los tubos del [place].   Si su medicamento es muy caro, por favor, pngase en contacto con Zigmund Daniel llamando al [phone] y presione la opcin 4 o envenos un mensaje a travs de Pharmacist, community.   No podemos decirle cul ser su copago por los medicamentos por adelantado ya que esto es diferente dependiendo de la cobertura de su seguro. Sin embargo, es posible que podamos encontrar un  medicamento sustituto a Electrical engineer un formulario para que el seguro cubra el medicamento que se considera necesario.   Si se requiere una autorizacin previa para que su compaa de seguros [place] su medicamento, por favor permtanos de 1 a 2 das hbiles para completar este proceso.  Los precios de los medicamentos varan con frecuencia dependiendo del Environmental consultant de dnde se surte la receta y alguna farmacias pueden ofrecer precios ms baratos.  El sitio web www.goodrx.com tiene cupones para medicamentos de Airline pilot. Los precios aqu no tienen en cuenta lo que podra costar con la ayuda del seguro (puede ser ms barato con su seguro), pero el sitio web puede darle el precio si no utiliz Research scientist (physical sciences).  - Puede imprimir el cupn correspondiente y llevarlo con su receta a la farmacia.  - Tambin puede pasar por nuestra oficina durante el horario de atencin regular y Charity fundraiser una tarjeta de cupones de GoodRx.  - Si necesita que su receta se enve electrnicamente a Chiropodist, informe a nuestra oficina a travs [place] o por telfono  llamando al [phone] y presione la opcin 4.

## 2022-05-01 NOTE — Progress Notes (Signed)
   Follow-Up Visit   Subjective  Hannah Hood is a 36 y.o. female who presents for the following: Follow-up (Patient here today for bx proven BCC follow up at left temple. Patient treated spot with 5FU/calcipotriene twice daily for 10 days. Patient advises she got red with no irritation but has noticed a few bumps towards bottom of EDC site. ).  Biopsy proven BCC treated with [facility] 01/06/2022. Patient is scheduled for Mohs in September for recurrent BCC at right chin.   The following portions of the chart were reviewed this encounter and updated as appropriate:   Allergies  Meds  Problems  Med Hx  Surg Hx  Fam Hx     Review of Systems:  No other skin or systemic complaints except as noted in HPI or Assessment and Plan.  Objective  Well appearing patient in no apparent distress; mood and affect are within normal limits.  A focused examination was performed including face. Relevant physical exam findings are noted in the Assessment and Plan.  left temple Healing pink biopsy/EDC site   Assessment & Plan  Basal cell carcinoma (BCC) of skin  left temple Treated with shave removal and EDC 01/2022 followed by adjunct treatment with 5-FU/calcipotriene  Restart 5FU/calcipotriene twice daily x 2 weeks.   Recurrent basal cell carcinoma (after past Mohs surgery) Right chin Bx proven recurrent BCC at right chin,   - patient scheduled for Mohs in September 2023 with Dr. Lacinda Axon at [facility] (Dr Lacinda Axon did original Mohs too).  Email sent today to [email or web address] advising BCC at Right chin is recurrent and asking them to confirm if photos sent were received.   Related Medications fluorouracil (EFUDEX) 5 % cream Apply twice a day for 7 days  Return in about 6 months (around 10/31/2022) for TBSE.  Graciella Belton, RMA, am acting as scribe for [name], MD . Documentation: I have reviewed the above documentation for accuracy and completeness, and I agree with the above.  [name],  MD

## 2022-05-11 ENCOUNTER — Encounter: Payer: Self-pay | Admitting: Dermatology

## 2022-08-25 ENCOUNTER — Telehealth: Payer: Self-pay

## 2022-08-25 NOTE — Telephone Encounter (Signed)
History and Specimen tracking updated per [facility] progress note.

## 2022-10-20 ENCOUNTER — Other Ambulatory Visit: Payer: Self-pay | Admitting: Infectious Diseases

## 2022-10-20 DIAGNOSIS — Z1231 Encounter for screening mammogram for malignant neoplasm of breast: Secondary | ICD-10-CM

## 2022-11-03 ENCOUNTER — Ambulatory Visit: Payer: BC Managed Care – PPO | Admitting: Dermatology

## 2022-11-03 DIAGNOSIS — Z1283 Encounter for screening for malignant neoplasm of skin: Secondary | ICD-10-CM | POA: Diagnosis not present

## 2022-11-03 DIAGNOSIS — Z85828 Personal history of other malignant neoplasm of skin: Secondary | ICD-10-CM | POA: Diagnosis not present

## 2022-11-03 DIAGNOSIS — L578 Other skin changes due to chronic exposure to nonionizing radiation: Secondary | ICD-10-CM

## 2022-11-03 DIAGNOSIS — L821 Other seborrheic keratosis: Secondary | ICD-10-CM

## 2022-11-03 DIAGNOSIS — D229 Melanocytic nevi, unspecified: Secondary | ICD-10-CM

## 2022-11-03 DIAGNOSIS — C44319 Basal cell carcinoma of skin of other parts of face: Secondary | ICD-10-CM

## 2022-11-03 DIAGNOSIS — D485 Neoplasm of uncertain behavior of skin: Secondary | ICD-10-CM

## 2022-11-03 DIAGNOSIS — C44310 Basal cell carcinoma of skin of unspecified parts of face: Secondary | ICD-10-CM

## 2022-11-03 DIAGNOSIS — Z86018 Personal history of other benign neoplasm: Secondary | ICD-10-CM

## 2022-11-03 DIAGNOSIS — L814 Other melanin hyperpigmentation: Secondary | ICD-10-CM | POA: Diagnosis not present

## 2022-11-03 DIAGNOSIS — D2372 Other benign neoplasm of skin of left lower limb, including hip: Secondary | ICD-10-CM

## 2022-11-03 DIAGNOSIS — D239 Other benign neoplasm of skin, unspecified: Secondary | ICD-10-CM

## 2022-11-03 NOTE — Patient Instructions (Addendum)
Electrodesiccation and Curettage ("Scrape and Burn") Wound Care Instructions  Leave the original bandage on for 24 hours if possible.  If the bandage becomes soaked or soiled before that time, it is OK to remove it and examine the wound.  A small amount of post-operative bleeding is normal.  If excessive bleeding occurs, remove the bandage, place gauze over the site and apply continuous pressure (no peeking) over the area for 30 minutes. If this does not work, please call our clinic as soon as possible or page your doctor if it is after hours.   Once a day, cleanse the wound with soap and water. It is fine to shower. If a thick crust develops you may use a Q-tip dipped into dilute hydrogen peroxide (mix 1:1 with water) to dissolve it.  Hydrogen peroxide can slow the healing process, so use it only as needed.    After washing, apply petroleum jelly (Vaseline) or an antibiotic ointment if your doctor prescribed one for you, followed by a bandage.    For best healing, the wound should be covered with a layer of ointment at all times. If you are not able to keep the area covered with a bandage to hold the ointment in place, this may mean re-applying the ointment several times a day.  Continue this wound care until the wound has healed and is no longer open. It may take several weeks for the wound to heal and close.  Itching and mild discomfort is normal during the healing process.  If you have any discomfort, you can take Tylenol (acetaminophen) or ibuprofen as directed on the bottle. (Please do not take these if you have an allergy to them or cannot take them for another reason).  Some redness, tenderness and white or yellow material in the wound is normal healing.  If the area becomes very sore and red, or develops a thick yellow-green material (pus), it may be infected; please notify us.    Wound healing continues for up to one year following surgery. It is not unusual to experience pain in the scar  from time to time during the interval.  If the pain becomes severe or the scar thickens, you should notify the office.    A slight amount of redness in a scar is expected for the first six months.  After six months, the redness will fade and the scar will soften and fade.  The color difference becomes less noticeable with time.  If there are any problems, return for a post-op surgery check at your earliest convenience.  To improve the appearance of the scar, you can use silicone scar gel, cream, or sheets (such as Mederma or Serica) every night for up to one year. These are available over the counter (without a prescription).  Please call our office at [phone] for any questions or concerns.     Due to recent changes in healthcare laws, you may see results of your pathology and/or laboratory studies on MyChart before the doctors have had a chance to review them. We understand that in some cases there may be results that are confusing or concerning to you. Please understand that not all results are received at the same time and often the doctors may need to interpret multiple results in order to provide you with the best plan of care or course of treatment. Therefore, we ask that you please give us 2 business days to thoroughly review all your results before contacting the office for clarification. Should   we see a critical lab result, you will be contacted sooner.   If You Need Anything After Your Visit  If you have any questions or concerns for your doctor, please call our main line at [phone] and press option 4 to reach your doctor's medical assistant. If no one answers, please leave a voicemail as directed and we will return your call as soon as possible. Messages left after 4 pm will be answered the following business day.   You may also send us a message via MyChart. We typically respond to MyChart messages within 1-2 business days.  For prescription refills, please ask your  pharmacy to contact our office. Our fax number is [phone].  If you have an urgent issue when the clinic is closed that cannot wait until the next business day, you can page your doctor at the number below.    Please note that while we do our best to be available for urgent issues outside of office hours, we are not available 24/7.   If you have an urgent issue and are unable to reach us, you may choose to seek medical care at your doctor's office, retail clinic, urgent care center, or emergency room.  If you have a medical emergency, please immediately call 911 or go to the emergency department.  Pager Numbers  - Dr. Kowalski: [phone]  - Dr. Moye: [phone]  - Dr. Stewart: [phone]  In the event of inclement weather, please call our main line at [phone] for an update on the status of any delays or closures.  Dermatology Medication Tips: Please keep the boxes that topical medications come in in order to help keep track of the instructions about where and how to use these. Pharmacies typically print the medication instructions only on the boxes and not directly on the medication tubes.   If your medication is too expensive, please contact our office at [phone] option 4 or send us a message through MyChart.   We are unable to tell what your co-pay for medications will be in advance as this is different depending on your insurance coverage. However, we may be able to find a substitute medication at lower cost or fill out paperwork to get insurance to cover a needed medication.   If a prior authorization is required to get your medication covered by your insurance company, please allow us 1-2 business days to complete this process.  Drug prices often vary depending on where the prescription is filled and some pharmacies may offer cheaper prices.  The website www.goodrx.com contains coupons for medications through different pharmacies. The prices here do not  account for what the cost may be with help from insurance (it may be cheaper with your insurance), but the website can give you the price if you did not use any insurance.  - You can print the associated coupon and take it with your prescription to the pharmacy.  - You may also stop by our office during regular business hours and pick up a GoodRx coupon card.  - If you need your prescription sent electronically to a different pharmacy, notify our office through [place] MyChart or by phone at [phone] option 4.     Si Usted Necesita Algo Despus de Su Visita  Tambin puede enviarnos un mensaje a travs de MyChart. Por lo general respondemos a los mensajes de MyChart en el transcurso de 1 a 2 das hbiles.  Para renovar recetas, por favor pida a su farmacia que se ponga en   contacto con nuestra oficina. Nuestro nmero de fax es el [phone].  Si tiene un asunto urgente cuando la clnica est cerrada y que no puede esperar hasta el siguiente da hbil, puede llamar/localizar a su doctor(a) al nmero que aparece a continuacin.   Por favor, tenga en cuenta que aunque hacemos todo lo posible para estar disponibles para asuntos urgentes fuera del horario de oficina, no estamos disponibles las 24 horas del da, los 7 das de la semana.   Si tiene un problema urgente y no puede comunicarse con nosotros, puede optar por buscar atencin mdica  en el consultorio de su doctor(a), en una clnica privada, en un centro de atencin urgente o en una sala de emergencias.  Si tiene una emergencia mdica, por favor llame inmediatamente al 911 o vaya a la sala de emergencias.  Nmeros de bper  - Dr. Kowalski: [phone]  - Dra. Moye: [phone]  - Dra. Stewart: [phone]  En caso de inclemencias del tiempo, por favor llame a nuestra lnea principal al [phone] para una actualizacin sobre el estado de cualquier retraso o cierre.  Consejos para la medicacin en dermatologa: Por  favor, guarde las cajas en las que vienen los medicamentos de uso tpico para ayudarle a seguir las instrucciones sobre dnde y cmo usarlos. Las farmacias generalmente imprimen las instrucciones del medicamento slo en las cajas y no directamente en los tubos del medicamento.   Si su medicamento es muy caro, por favor, pngase en contacto con nuestra oficina llamando al [phone] y presione la opcin 4 o envenos un mensaje a travs de MyChart.   No podemos decirle cul ser su copago por los medicamentos por adelantado ya que esto es diferente dependiendo de la cobertura de su seguro. Sin embargo, es posible que podamos encontrar un medicamento sustituto a menor costo o llenar un formulario para que el seguro cubra el medicamento que se considera necesario.   Si se requiere una autorizacin previa para que su compaa de seguros cubra su medicamento, por favor permtanos de 1 a 2 das hbiles para completar este proceso.  Los precios de los medicamentos varan con frecuencia dependiendo del lugar de dnde se surte la receta y alguna farmacias pueden ofrecer precios ms baratos.  El sitio web www.goodrx.com tiene cupones para medicamentos de diferentes farmacias. Los precios aqu no tienen en cuenta lo que podra costar con la ayuda del seguro (puede ser ms barato con su seguro), pero el sitio web puede darle el precio si no utiliz ningn seguro.  - Puede imprimir el cupn correspondiente y llevarlo con su receta a la farmacia.  - Tambin puede pasar por nuestra oficina durante el horario de atencin regular y recoger una tarjeta de cupones de GoodRx.  - Si necesita que su receta se enve electrnicamente a una farmacia diferente, informe a nuestra oficina a travs de MyChart de [place] o por telfono llamando al [phone] y presione la opcin 4.  

## 2022-11-03 NOTE — Progress Notes (Signed)
Follow-Up Visit   Subjective  Hannah Hood is a 36 y.o. female who presents for the following: Annual Exam (Hx BCC, dysplastic nevi ). The patient presents for Total-Body Skin Exam (TBSE) for skin cancer screening and mole check.  The patient has spots, moles and lesions to be evaluated, some may be new or changing and the patient has concerns that these could be cancer.  The following portions of the chart were reviewed this encounter and updated as appropriate:   Allergies  Meds  Problems  Med Hx  Surg Hx  Fam Hx     Review of Systems:  No other skin or systemic complaints except as noted in HPI or Assessment and Plan.  Objective  Well appearing patient in no apparent distress; mood and affect are within normal limits.  A full examination was performed including scalp, head, eyes, ears, nose, lips, neck, chest, axillae, abdomen, back, buttocks, bilateral upper extremities, bilateral lower extremities, hands, feet, fingers, toes, fingernails, and toenails. All findings within normal limits unless otherwise noted below.  L temple/scalp outter edge of BCC site Pink papules 1.7 cm       Assessment & Plan  Neoplasm of uncertain behavior of skin L temple/scalp outter edge of BCC site  Epidermal / dermal shaving  Lesion diameter (cm):  1.7 Informed consent: discussed and consent obtained   Patient was prepped and draped in usual sterile fashion: area prepped with alcohol. Anesthesia: the lesion was anesthetized in a standard fashion   Anesthetic:  1% lidocaine w/ epinephrine 1-100,000 buffered w/ 8.4% NaHCO3 Instrument used: flexible razor blade   Hemostasis achieved with: pressure, aluminum chloride and electrodesiccation   Outcome: patient tolerated procedure well   Post-procedure details: wound care instructions given   Post-procedure details comment:  Ointment and small bandage applied  Destruction of lesion Complexity: extensive   Destruction method:  electrodesiccation and curettage   Informed consent: discussed and consent obtained   Timeout:  patient name, date of birth, surgical site, and procedure verified Procedure prep:  Patient was prepped and draped in usual sterile fashion Prep type:  Isopropyl alcohol Anesthesia: the lesion was anesthetized in a standard fashion   Anesthetic:  1% lidocaine w/ epinephrine 1-100,000 buffered w/ 8.4% NaHCO3 Curettage performed in three different directions: Yes   Electrodesiccation performed over the curetted area: Yes   Lesion length (cm):  1.7 Lesion width (cm):  1.7 Margin per side (cm):  0.2 Final wound size (cm):  2.1 Hemostasis achieved with:  pressure, aluminum chloride and electrodesiccation Outcome: patient tolerated procedure well with no complications   Post-procedure details: sterile dressing applied and wound care instructions given   Dressing type: bandage and petrolatum    Specimen 1 - Surgical pathology Differential Diagnosis: D48.5 r/o recurrent BCC  Check Margins: No [id number]  Lentigines - Scattered tan macules - Due to sun exposure - Benign-appearing, observe - Recommend daily broad spectrum sunscreen SPF 30+ to sun-exposed areas, reapply every 2 hours as needed. - Call for any changes  Seborrheic Keratoses - Stuck-on, waxy, tan-brown papules and/or plaques  - Benign-appearing - Discussed benign etiology and prognosis. - Observe - Call for any changes  Melanocytic Nevi - Tan-brown and/or pink-flesh-colored symmetric macules and papules - Benign appearing on exam today - Observation - Call clinic for new or changing moles - Recommend daily use of broad spectrum spf 30+ sunscreen to sun-exposed areas.   Hemangiomas - Red papules - Discussed benign nature - Observe - Call for any changes  Actinic Damage - Chronic condition, secondary to cumulative UV/sun exposure - diffuse scaly erythematous macules with underlying dyspigmentation - Recommend daily  broad spectrum sunscreen SPF 30+ to sun-exposed areas, reapply every 2 hours as needed.  - Staying in the shade or wearing long sleeves, sun glasses (UVA+UVB protection) and wide brim hats (4-inch brim around the entire circumference of the hat) are also recommended for sun protection.  - Call for new or changing lesions.  History of Basal Cell Carcinoma of the Skin -right chin x 2 with history of recurrence status post Mohs x 2 Reviewed history and notes. - No evidence of recurrence today - Recommend regular full body skin exams - Recommend daily broad spectrum sunscreen SPF 30+ to sun-exposed areas, reapply every 2 hours as needed.  - Call if any new or changing lesions are noted between office visits  History of Dysplastic Nevus - R upper back paraspinal, mild - No evidence of recurrence today - Recommend regular full body skin exams - Recommend daily broad spectrum sunscreen SPF 30+ to sun-exposed areas, reapply every 2 hours as needed.  - Call if any new or changing lesions are noted between office visits  Dermatofibroma - L med pretibial - Firm pink/brown papulenodule with dimple sign - Benign appearing - Call for any changes  Skin cancer screening performed today.  Return in about 6 months (around 05/05/2023) for TBSE.  Luther Redo, CMA, am acting as scribe for [name], MD . Documentation: I have reviewed the above documentation for accuracy and completeness, and I agree with the above.  [name], MD

## 2022-11-12 ENCOUNTER — Telehealth: Payer: Self-pay

## 2022-11-12 NOTE — Telephone Encounter (Signed)
LM on VM please return my call  

## 2022-11-12 NOTE — Telephone Encounter (Signed)
-----   Message from Ralene Bathe, MD sent at 11/11/2022  9:23 AM EST ----- Diagnosis Skin , left temple/scalp outer edge of bcc site BASAL CELL CARCINOMA, NODULAR PATTERN  Cancer - BCC - recurrent - adjacent to previous [place] site Already treated May need additional procedure if persistence or recurrence in future Recheck next visit

## 2022-11-13 ENCOUNTER — Encounter: Payer: Self-pay | Admitting: Radiology

## 2022-11-13 ENCOUNTER — Ambulatory Visit
Admission: RE | Admit: 2022-11-13 | Discharge: 2022-11-13 | Disposition: A | Discharge status: Home / Self Care | Payer: BC Managed Care – PPO | Source: Ambulatory Visit | Attending: Infectious Diseases | Admitting: Infectious Diseases

## 2022-11-13 DIAGNOSIS — Z1231 Encounter for screening mammogram for malignant neoplasm of breast: Secondary | ICD-10-CM | POA: Diagnosis present

## 2022-11-16 ENCOUNTER — Encounter: Payer: Self-pay | Admitting: Dermatology

## 2022-11-18 ENCOUNTER — Telehealth: Payer: Self-pay

## 2022-11-18 NOTE — Telephone Encounter (Signed)
Advised pt of bx results. Pt said since she has had so many skin cancers she has decided to start going to [facility].  I advised she would need to come in office and sign a medical release form and provide new doctors name and fax before we could send medical records.  She did not want to cancel her f/u with Dr. Nehemiah Massed in 05/2023/sh

## 2022-11-18 NOTE — Telephone Encounter (Signed)
-----   Message from Ralene Bathe, MD sent at 11/11/2022  9:23 AM EST ----- Diagnosis Skin , left temple/scalp outer edge of bcc site BASAL CELL CARCINOMA, NODULAR PATTERN  Cancer - BCC - recurrent - adjacent to previous [place] site Already treated May need additional procedure if persistence or recurrence in future Recheck next visit

## 2023-03-31 IMAGING — MG MM DIGITAL SCREENING BILAT W/ TOMO AND CAD
8 series · 8 of 24 positions shown · non-contrast
Comparison: None.

CLINICAL DATA: Screening.

EXAM:
DIGITAL SCREENING BILATERAL MAMMOGRAM WITH TOMOSYNTHESIS AND CAD
TECHNIQUE: Bilateral screening digital craniocaudal and mediolateral oblique
mammograms were obtained. Bilateral screening digital breast
tomosynthesis was performed. The images were evaluated with
computer-aided detection.

[L CC synth-2D]
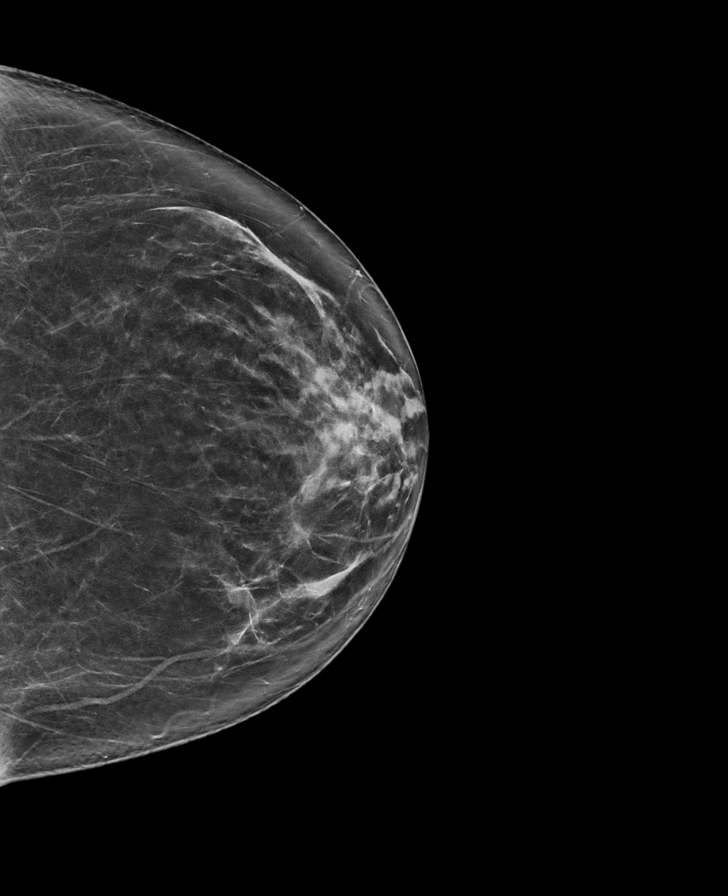

[L MLO synth-2D]
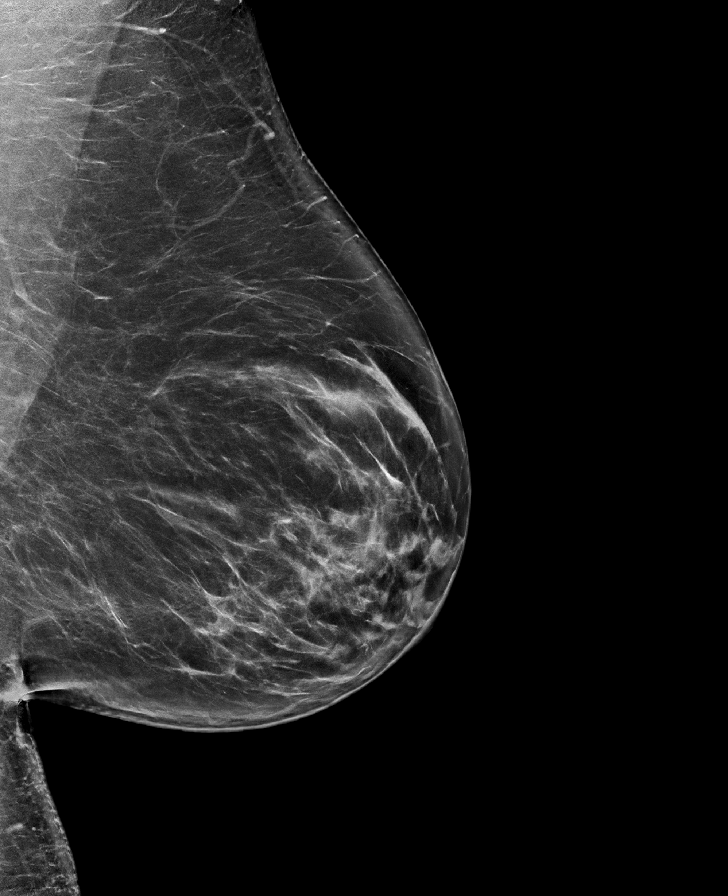

[R CC synth-2D]
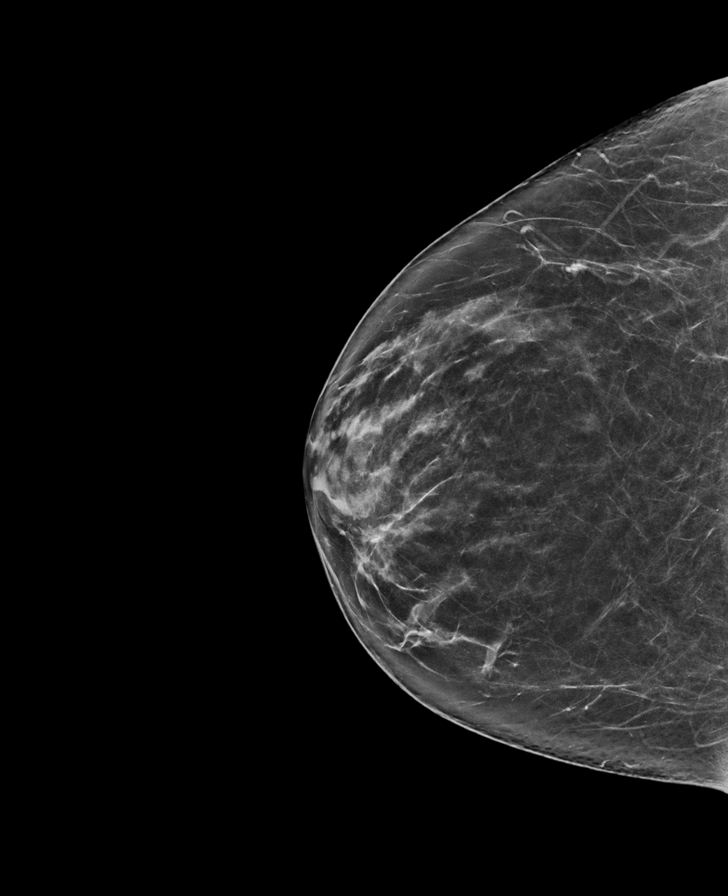

[R MLO synth-2D]
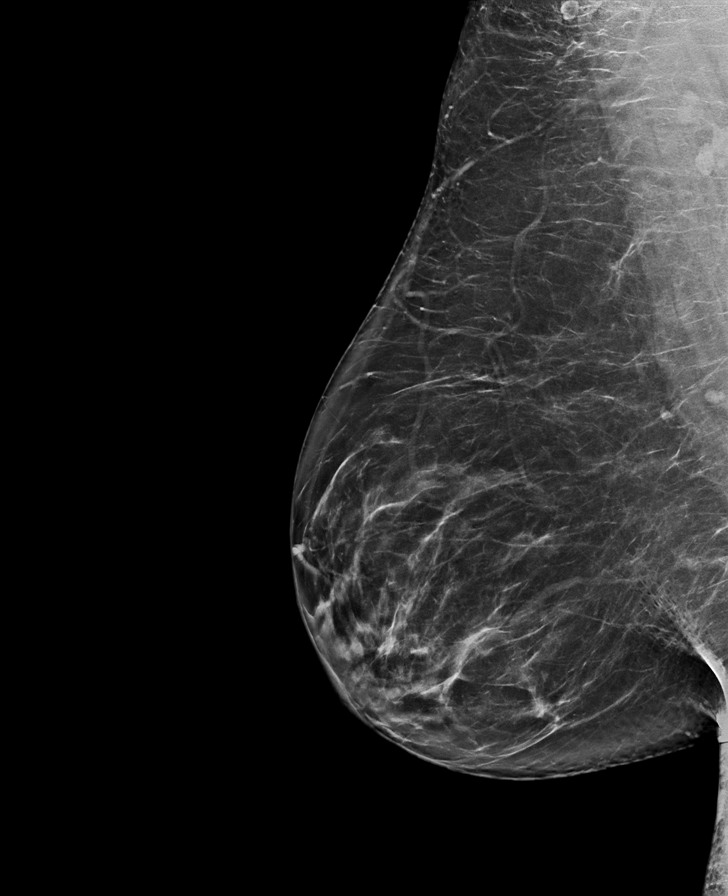

[L MLO tomo · tomo slice 45/90.0]
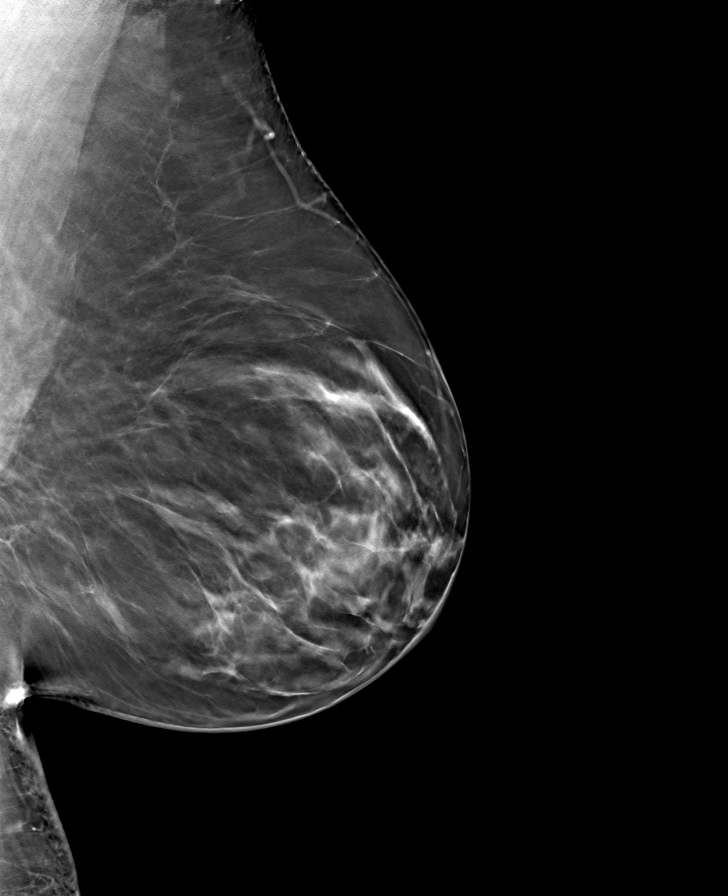

[R MLO tomo · tomo slice 43/86.0]
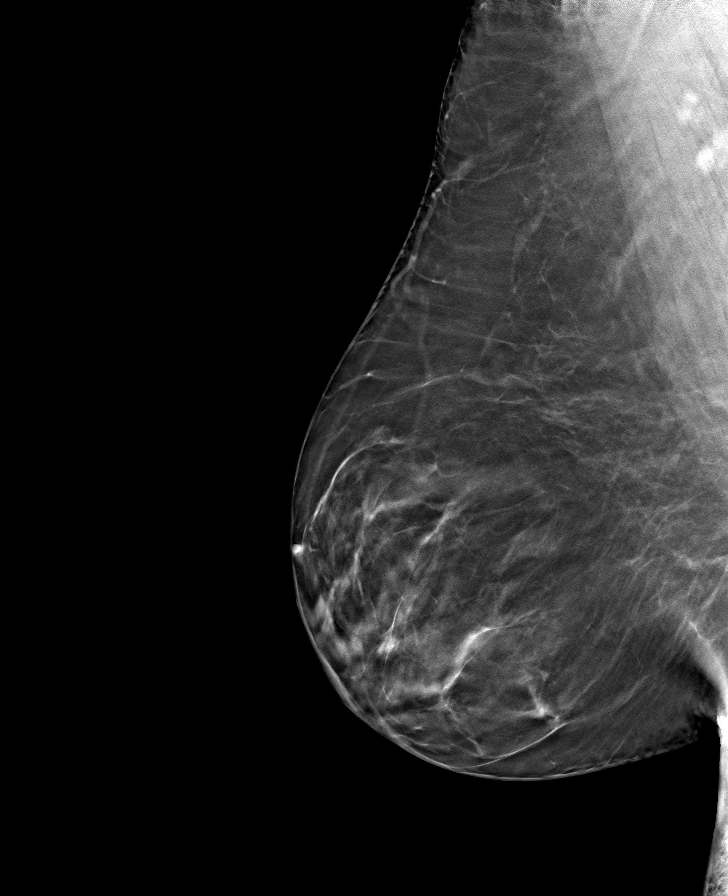

[R CC tomo · tomo slice 39/77.0]
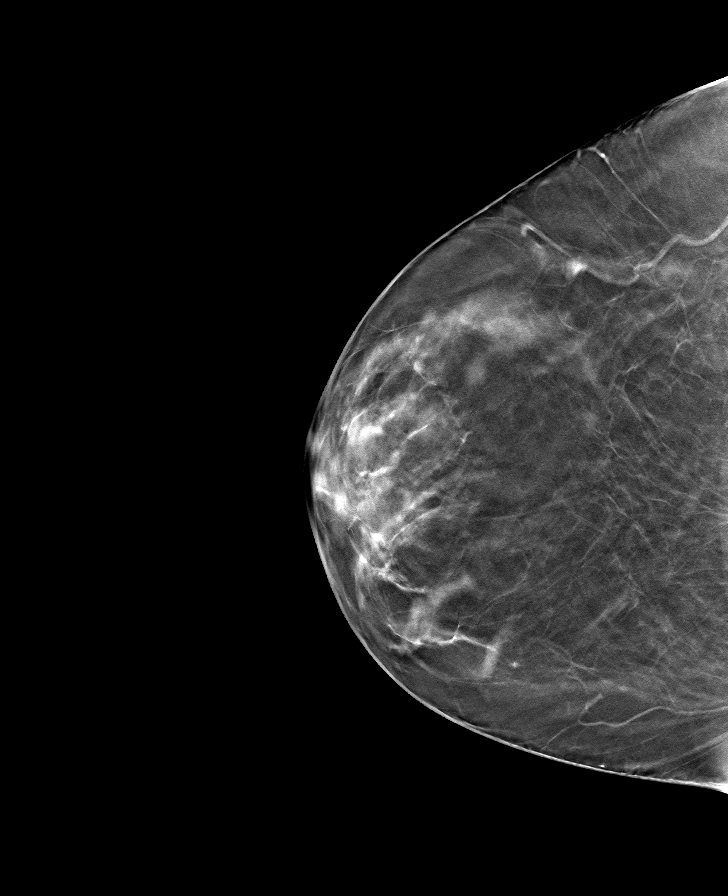

[L CC tomo · tomo slice 41/81.0]
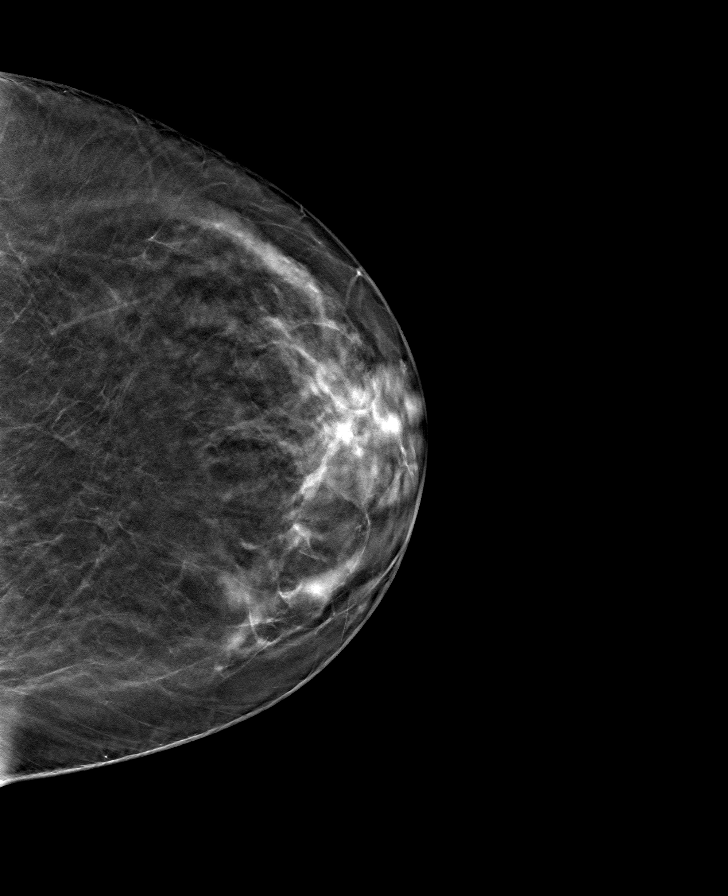

[8 of 24 positions shown; findings below may reference images not displayed]

ACR Breast Density Category b: There are scattered areas of
fibroglandular density.
FINDINGS: There are no findings suspicious for malignancy.
IMPRESSION: No mammographic evidence of malignancy. A result letter of this
screening mammogram will be mailed directly to the patient.

RECOMMENDATION:
Screening mammogram at age 41. (Code:[id number]-031)

BI-RADS CATEGORY  1: Negative.

## 2023-05-14 ENCOUNTER — Encounter: Payer: BC Managed Care – PPO | Admitting: Dermatology

## 2023-09-23 ENCOUNTER — Other Ambulatory Visit: Payer: Self-pay | Admitting: Infectious Diseases

## 2023-09-23 DIAGNOSIS — Z1231 Encounter for screening mammogram for malignant neoplasm of breast: Secondary | ICD-10-CM

## 2023-12-08 ENCOUNTER — Ambulatory Visit
Admission: RE | Admit: 2023-12-08 | Discharge: 2023-12-08 | Disposition: A | Discharge status: Home / Self Care | Payer: 59 | Source: Ambulatory Visit | Attending: Infectious Diseases | Admitting: Infectious Diseases

## 2023-12-08 DIAGNOSIS — Z1231 Encounter for screening mammogram for malignant neoplasm of breast: Secondary | ICD-10-CM | POA: Insufficient documentation

## 2024-01-21 ENCOUNTER — Telehealth: Payer: Self-pay

## 2024-01-21 NOTE — Telephone Encounter (Signed)
Specimen tracking completed from [facility] 01/14/2024 of BCC L lateral temple. aw

## 2025-01-03 ENCOUNTER — Other Ambulatory Visit: Payer: Self-pay | Admitting: Infectious Diseases

## 2025-01-03 DIAGNOSIS — Z803 Family history of malignant neoplasm of breast: Secondary | ICD-10-CM

## 2025-01-03 DIAGNOSIS — Z1231 Encounter for screening mammogram for malignant neoplasm of breast: Secondary | ICD-10-CM

## 2025-01-06 ENCOUNTER — Inpatient Hospital Stay: Admission: RE | Admit: 2025-01-06

## 2025-01-06 DIAGNOSIS — Z803 Family history of malignant neoplasm of breast: Secondary | ICD-10-CM

## 2025-01-06 DIAGNOSIS — Z1231 Encounter for screening mammogram for malignant neoplasm of breast: Secondary | ICD-10-CM
